# Patient Record
Sex: Female | Born: 1985 | Race: White | Hispanic: No | Marital: Single | Smoking: Never smoker
Health system: Southern US, Community
[De-identification: ages and names within clinical notes are randomized; demographics above are authoritative.]

## PROBLEM LIST (undated history)

## (undated) DIAGNOSIS — F419 Anxiety disorder, unspecified: Secondary | ICD-10-CM

---

## 2018-07-12 ENCOUNTER — Encounter (HOSPITAL_COMMUNITY): Payer: Self-pay | Admitting: Emergency Medicine

## 2018-07-12 ENCOUNTER — Ambulatory Visit (HOSPITAL_COMMUNITY)
Admission: EM | Admit: 2018-07-12 | Discharge: 2018-07-12 | Disposition: A | Discharge status: Home / Self Care | Payer: BLUE CROSS/BLUE SHIELD | Source: Home / Self Care | Attending: Family Medicine | Admitting: Family Medicine

## 2018-07-12 ENCOUNTER — Emergency Department (HOSPITAL_COMMUNITY)
Admission: EM | Admit: 2018-07-12 | Discharge: 2018-07-12 | Disposition: A | Discharge status: Home / Self Care | Payer: BLUE CROSS/BLUE SHIELD | Attending: Emergency Medicine | Admitting: Emergency Medicine

## 2018-07-12 ENCOUNTER — Emergency Department (HOSPITAL_COMMUNITY): Payer: BLUE CROSS/BLUE SHIELD

## 2018-07-12 ENCOUNTER — Encounter (HOSPITAL_COMMUNITY): Payer: Self-pay | Admitting: Family Medicine

## 2018-07-12 ENCOUNTER — Other Ambulatory Visit: Payer: Self-pay

## 2018-07-12 DIAGNOSIS — R519 Headache, unspecified: Secondary | ICD-10-CM

## 2018-07-12 DIAGNOSIS — R51 Headache: Secondary | ICD-10-CM | POA: Diagnosis not present

## 2018-07-12 DIAGNOSIS — F419 Anxiety disorder, unspecified: Secondary | ICD-10-CM

## 2018-07-12 HISTORY — DX: Anxiety disorder, unspecified: F41.9

## 2018-07-12 MED ORDER — METOCLOPRAMIDE HCL 5 MG/ML IJ SOLN
10.0000 mg | Freq: Once | INTRAMUSCULAR | Status: AC
  Administered 2018-07-12: 10 mg via INTRAVENOUS
  Filled 2018-07-12: qty 2

## 2018-07-12 MED ORDER — ONDANSETRON 4 MG PO TBDP
ORAL_TABLET | ORAL | Status: AC
  Filled 2018-07-12: qty 1

## 2018-07-12 MED ORDER — KETOROLAC TROMETHAMINE 60 MG/2ML IM SOLN
60.0000 mg | Freq: Once | INTRAMUSCULAR | Status: AC
  Administered 2018-07-12: 12:00:00 60 mg via INTRAMUSCULAR

## 2018-07-12 MED ORDER — LORAZEPAM 2 MG/ML IJ SOLN
2.0000 mg | Freq: Once | INTRAMUSCULAR | Status: AC
  Administered 2018-07-12: 2 mg via INTRAMUSCULAR

## 2018-07-12 MED ORDER — KETOROLAC TROMETHAMINE 60 MG/2ML IM SOLN
INTRAMUSCULAR | Status: AC
  Filled 2018-07-12: qty 2

## 2018-07-12 MED ORDER — ONDANSETRON 4 MG PO TBDP
4.0000 mg | ORAL_TABLET | Freq: Once | ORAL | Status: AC
  Administered 2018-07-12: 12:00:00 4 mg via ORAL

## 2018-07-12 MED ORDER — SODIUM CHLORIDE 0.9 % IV BOLUS
1000.0000 mL | Freq: Once | INTRAVENOUS | Status: AC
  Administered 2018-07-12: 14:00:00 1000 mL via INTRAVENOUS

## 2018-07-12 MED ORDER — LORAZEPAM 2 MG/ML IJ SOLN
INTRAMUSCULAR | Status: AC
  Filled 2018-07-12: qty 1

## 2018-07-12 MED ORDER — METOCLOPRAMIDE HCL 5 MG/ML IJ SOLN: 5.0000 mg | Freq: Once | INTRAMUSCULAR | Status: DC

## 2018-07-12 NOTE — Discharge Instructions (Signed)
Please read attached information. If you experience any new or worsening signs or symptoms please return to the emergency room for evaluation. Please follow-up with your primary care provider or specialist as discussed. Please use medication prescribed only as directed and discontinue taking if you have any concerning signs or symptoms.   °

## 2018-07-12 NOTE — ED Triage Notes (Signed)
Pt here from UC after waking up with severe headache and episode of vomiting from pain. Pt here for head CT and migraine cocktail after no relief from ativan at [facility]. Hx of similar migraine x 1.

## 2018-07-12 NOTE — ED Triage Notes (Signed)
Pt here for HA starting this morning with vomiting x 1; pt sts anxiety at present and appears anxious

## 2018-07-12 NOTE — ED Provider Notes (Signed)
[facility]   CSN: [id number] Arrival date & time: 07/12/18  1320    History   Chief Complaint Chief Complaint  Patient presents with   Migraine    HPI Dana OldJulie Powell is a 33 y.o. female.     HPI   33 year Powell female presents today with complaints of headache.  Patient notes that she woke up this morning with a frontal headache described as pressure.  She notes this feels like "a migraine".  She notes that usually when she has migraines they are not this severe.  She notes this is worse on the right side temporal region.  She notes that she has sought emergency care previously for headaches.  She denies any fever, neck stiffness, IV drug use, neurological deficits including vision changes.  She notes she felt nauseous this morning and feels like she does not want to eat now.  She denies any trauma to her head.  She was seen in urgent care where she was given benzodiazepine, Toradol and Zofran.  She notes she is still having a headache but notes her anxiety has improved.  She notes a recent dental extraction approximately 3 weeks ago, she denies any complaints of dental infection.  Past Medical History:  Diagnosis Date   Anxiety     There are no active problems to display for this patient.   History reviewed. No pertinent surgical history.   OB History   No obstetric history on file.      Home Medications    Prior to Admission medications   Not on File    Family History No family history on file.  Social History Social History   Tobacco Use   Smoking status: Not on file  Substance Use Topics   Alcohol use: Not on file   Drug use: Not on file     Allergies   Benadryl [diphenhydramine] and Nyquil severe cold-flu [phenyleph-doxylamine-dm-apap]   Review of Systems Review of Systems  All other systems reviewed and are negative.    Physical Exam Updated Vital Signs BP (!) 130/93 (BP Location:  Right Arm)    Pulse 86    Temp 98.3 F (36.8 C) (Oral)    Resp 18    SpO2 98%   Physical Exam Vitals signs and nursing note reviewed.  Constitutional:      Appearance: She is well-developed.  HENT:     Head: Normocephalic and atraumatic.     Comments: Oropharynx without edema, dentition without gumline swelling discharge or signs of infection Eyes:     General: No scleral icterus.       Right eye: No discharge.        Left eye: No discharge.     Conjunctiva/sclera: Conjunctivae normal.     Pupils: Pupils are equal, round, and reactive to light.  Neck:     Musculoskeletal: Normal range of motion.     Vascular: No JVD.     Trachea: No tracheal deviation.     Comments: Neck is supple with full active range of motion Pulmonary:     Effort: Pulmonary effort is normal.     Breath sounds: No stridor.  Neurological:     General: No focal deficit present.     Mental Status: She is alert and oriented to person, place, and time.     Cranial Nerves: No cranial nerve deficit.     Sensory: No sensory deficit.     Motor: No weakness.  Coordination: Coordination normal.  Psychiatric:        Mood and Affect: Mood normal.        Behavior: Behavior normal.        Thought Content: Thought content normal.        Judgment: Judgment normal.      ED Treatments / Results  Labs (all labs ordered are listed, but only abnormal results are displayed) Labs Reviewed - No data to display  EKG None  Radiology Ct Head Wo Contrast  Result Date: 07/12/2018 CLINICAL DATA:  Headaches EXAM: CT HEAD WITHOUT CONTRAST TECHNIQUE: Contiguous axial images were obtained from the base of the skull through the vertex without intravenous contrast. COMPARISON:  None. FINDINGS: Brain: No evidence of acute infarction, hemorrhage, hydrocephalus, extra-axial collection or mass lesion/mass effect. Vascular: No hyperdense vessel or unexpected calcification. Skull: No osseous abnormality. Sinuses/Orbits: Visualized  paranasal sinuses are clear. Visualized mastoid sinuses are clear. Visualized orbits demonstrate no focal abnormality. Other: None IMPRESSION: No acute intracranial pathology. Electronically Signed   By: Elige Ko   On: 07/12/2018 15:20    Procedures Procedures (including critical care time)  Medications Ordered in ED Medications  sodium chloride 0.9 % bolus 1,000 mL (1,000 mLs Intravenous New Bag/Given 07/12/18 1406)  metoCLOPramide (REGLAN) injection 10 mg (10 mg Intravenous Given 07/12/18 1407)     Initial Impression / Assessment and Plan / ED Course  I have reviewed the triage vital signs and the nursing notes.  Pertinent labs & imaging results that were available during my care of the patient were reviewed by me and considered in my medical decision making (see chart for details).       Labs:   Imaging:  Consults:  Therapeutics:  Discharge Meds:   Assessment/Plan: This is a 33 year Powell female with headache.  She notes she awoke with a headache this morning.  She has no acute neurological deficits, signs of trauma, neck stiffness, or any other red flags on my exam.  She notes similar history of migraines but also notes that this feels different.  Given her persistent symptoms head CT will be ordered.  I have very low suspicion for acute thrombotic event including sinus venous thrombosis, SAH, meningitis, dental pain causing headache, bleed, or any other acute life-threatening etiology at this time.   Pt's pain almost completely resolved- head CT is normal. She is stable for discharge with return precautions. She verbalized understanding and agreement to today's plan. No further questions or concerns.      Final Clinical Impressions(s) / ED Diagnoses   Final diagnoses:  Acute nonintractable headache, unspecified headache type    ED Discharge Orders    None       Rosalio Loud 07/12/18 1546    Tilden Fossa, MD 07/12/18 [phone]

## 2018-07-12 NOTE — ED Notes (Signed)
Patient transported to CT 

## 2018-07-12 NOTE — ED Notes (Signed)
Patient verbalizes understanding of discharge instructions . Opportunity for questions and answers were provided . Armband removed by staff ,Pt discharged from ED. W/C  offered at D/C  and Declined W/C at D/C and was escorted to lobby by RN.  

## 2018-07-12 NOTE — ED Provider Notes (Signed)
[facility] CARE CENTER   [id number] 07/12/18 Arrival Time: 1127  ASSESSMENT & PLAN:  1. Bad headache   2. Anxiety    Meds ordered this encounter  Medications  . ketorolac (TORADOL) injection 60 mg  . ondansetron (ZOFRAN-ODT) disintegrating tablet 4 mg  . LORazepam (ATIVAN) injection 2 mg   No focal neurologic abnormalities. No fever, nuchal rigidity, or vision changes. Reports feeling less anxious after medications above. Headache is basically unchanged after Toradol. After discussion, she elects ED evaluation vs home observation. To ED now via private vehicle. Stable upon discharge.  Reviewed expectations re: course of current medical issues. Questions answered. Outlined signs and symptoms indicating need for more acute intervention. Patient verbalized understanding. After Visit Summary given.   SUBJECTIVE:  Dana Powell is a 33 y.o. female who presents with complaint of a headache and associated "lightheaded feeling". Describes headache as a "migraine" but no formal diagnosis of migraines in the past. Reports abrupt onset approx 3 hours ago upon waking. Location: right-sided unilateral, temporal without radiation. History of headaches: yes but not very frequent. Has been hospitalized once in the distant past for a similar headache. Precipitating factors include: none which have been determined. Associated symptoms: Preceding aura: no. Nausea/vomiting: mild with one emesis this morning. Vision changes: no. Increased sensitivity to light and to noises: mild. Fever: no. No neck pain reported. Sinus pressure/congestion: no. Extremity weakness: no. Home treatment has included nothing. Current headache has not limited normal daily activities. Denies loss of balance, muscle weakness, numbness of extremities, speech difficulties and vision problems. No head injury reported. Ambulatory without difficulty.  No tobacco use. Occasional alcohol use. No illicit drug use.  ROS: As  per HPI. All other systems negative.    OBJECTIVE:  Vitals:   07/12/18 1143  BP: 131/88  Pulse: (!) 109  Resp: 20  Temp: 98.4 F (36.9 C)  TempSrc: Oral  SpO2: 98%    General appearance: alert; very anxious; pacing room and holding her head in her hands Eyes: PERRLA; EOMI; conjunctiva normal HENT: normocephalic; atraumatic Neck: supple with FROM Lungs: clear to auscultation bilaterally Heart: slight tachycardia; regular rhythm Extremities: no edema; symmetrical with no gross deformities Skin: warm and dry Neurologic: alert; clear speech; CN 2-12 grossly intact; no facial droop; normal gait; normal symmetric reflexes; normal extremity strength and sensation throughout; no seizure activity; no nuchal rigidity Psychological: alert and cooperative  Allergies  Allergen Reactions  . Benadryl [Diphenhydramine]   . Nyquil Severe Cold-Flu [Phenyleph-Doxylamine-Dm-Apap]     Past Medical History:  Diagnosis Date  . Anxiety    Social History   Socioeconomic History  . Marital status: Single    Spouse name: Not on file  . Number of children: Not on file  . Years of education: Not on file  . Highest education level: Not on file  Occupational History  . Not on file  Social Needs  . Financial resource strain: Not on file  . Food insecurity:    Worry: Not on file    Inability: Not on file  . Transportation needs:    Medical: Not on file    Non-medical: Not on file  Tobacco Use  . Smoking status: Not on file  Substance and Sexual Activity  . Alcohol use: Not on file  . Drug use: Not on file  . Sexual activity: Not on file  Lifestyle  . Physical activity:    Days per week: Not on file    Minutes per session: Not on file  .  Stress: Not on file  Relationships  . Social connections:    Talks on phone: Not on file    Gets together: Not on file    Attends religious service: Not on file    Active member of club or organization: Not on file    Attends meetings of clubs or  organizations: Not on file    Relationship status: Not on file  . Intimate partner violence:    Fear of current or ex partner: Not on file    Emotionally abused: Not on file    Physically abused: Not on file    Forced sexual activity: Not on file  Other Topics Concern  . Not on file  Social History Narrative  . Not on file   FH: Question of HTN.  History reviewed. No pertinent surgical history.   Mardella LaymanHagler, Dana Liebert, MD 07/12/18 1311

## 2018-07-14 ENCOUNTER — Encounter (HOSPITAL_COMMUNITY): Payer: Self-pay | Admitting: *Deleted

## 2018-07-14 ENCOUNTER — Other Ambulatory Visit: Payer: Self-pay

## 2018-07-14 ENCOUNTER — Emergency Department (HOSPITAL_COMMUNITY)
Admission: EM | Admit: 2018-07-14 | Discharge: 2018-07-14 | Disposition: A | Discharge status: Home / Self Care | Payer: BLUE CROSS/BLUE SHIELD | Attending: Emergency Medicine | Admitting: Emergency Medicine

## 2018-07-14 DIAGNOSIS — R519 Headache, unspecified: Secondary | ICD-10-CM

## 2018-07-14 DIAGNOSIS — R51 Headache: Secondary | ICD-10-CM | POA: Diagnosis not present

## 2018-07-14 MED ORDER — SODIUM CHLORIDE 0.9 % IV BOLUS
1000.0000 mL | Freq: Once | INTRAVENOUS | Status: AC
  Administered 2018-07-14: 16:00:00 1000 mL via INTRAVENOUS

## 2018-07-14 MED ORDER — METOCLOPRAMIDE HCL 5 MG/ML IJ SOLN
10.0000 mg | Freq: Once | INTRAMUSCULAR | Status: AC
  Administered 2018-07-14: 14:00:00 10 mg via INTRAVENOUS
  Filled 2018-07-14: qty 2

## 2018-07-14 MED ORDER — KETOROLAC TROMETHAMINE 30 MG/ML IJ SOLN
30.0000 mg | Freq: Once | INTRAMUSCULAR | Status: AC
  Administered 2018-07-14: 14:00:00 30 mg via INTRAVENOUS
  Filled 2018-07-14: qty 1

## 2018-07-14 MED ORDER — METOCLOPRAMIDE HCL 10 MG PO TABS: 10.0000 mg | ORAL_TABLET | Freq: Four times a day (QID) | ORAL | 0 refills | Status: AC

## 2018-07-14 MED ORDER — LORAZEPAM 2 MG/ML IJ SOLN
1.0000 mg | Freq: Once | INTRAMUSCULAR | Status: AC
  Administered 2018-07-14: 14:00:00 1 mg via INTRAVENOUS
  Filled 2018-07-14: qty 1

## 2018-07-14 NOTE — ED Notes (Signed)
Patient verbalizes understanding of discharge instructions . Opportunity for questions and answers were provided . Armband removed by staff ,Pt discharged from ED. W/C  offered at D/C  and Declined W/C at D/C and was escorted to lobby by RN.  

## 2018-07-14 NOTE — ED Triage Notes (Signed)
PT returns today for on going HA . Pt was in ED Monday . Dana Powell has not improved. Pt rfeports she had a CT of head on Monday.

## 2018-07-14 NOTE — ED Provider Notes (Signed)
[facility]ERGENCY DEPARTMENT Provider Note   CSN: [id number] Arrival date & time: 07/14/18  1247    History   Chief Complaint Chief Complaint  Patient presents with  . Headache    HPI Dana Powell is a 33 y.o. female.     HPI   33 year old female presents today with complaints of headache.  Patient notes she woke up this morning and developed frontal throbbing pressure headache.  She notes pressure behind her eyes.  She notes light sensitivity, denies any neurological complaints, vision changes, fever, neck stiffness, trauma, or any other red flags.  Patient was seen and evaluated for similar symptoms on 5/25, 2 days ago where she had a head CT she had near complete symptom resolution at the time of discharge.  She notes the symptoms had stayed away.  She denies any new changes in lifestyle including diet or activity.  No trauma.  She notes that the symptoms are slightly worse when laying back and improved when sitting up.  Nausea denies vomiting.  She notes a similar headache this severe approximately 2 years ago.   Past Medical History:  Diagnosis Date  . Anxiety     There are no active problems to display for this patient.   History reviewed. No pertinent surgical history.   OB History   No obstetric history on file.      Home Medications    Prior to Admission medications   Medication Sig Start Date End Date Taking? Authorizing Provider  metoCLOPramide (REGLAN) 10 MG tablet Take 1 tablet (10 mg total) by mouth every 6 (six) hours. 07/14/18   Eyvonne Mechanic, PA-C    Family History History reviewed. No pertinent family history.  Social History Social History   Tobacco Use  . Smoking status: Never Smoker  . Smokeless tobacco: Never Used  Substance Use Topics  . Alcohol use: Yes    Comment: social  . Drug use: Never     Allergies   Benadryl [diphenhydramine] and Nyquil severe cold-flu [phenyleph-doxylamine-dm-apap]   Review  of Systems Review of Systems  All other systems reviewed and are negative.    Physical Exam Updated Vital Signs BP 117/80 (BP Location: Right Arm)   Pulse 92   Temp 98.6 F (37 C) (Oral)   Resp 16   Ht 5\' 4"  (1.626 m)   Wt 80.7 kg   LMP 07/02/2018   SpO2 97%   BMI 30.55 kg/m   Physical Exam Vitals signs and nursing note reviewed.  Constitutional:      General: She is not in acute distress.    Appearance: She is well-developed. She is not diaphoretic.  HENT:     Head: Normocephalic and atraumatic.  Eyes:     General: No scleral icterus.       Right eye: No discharge.        Left eye: No discharge.     Conjunctiva/sclera: Conjunctivae normal.     Pupils: Pupils are equal, round, and reactive to light.  Neck:     Musculoskeletal: Normal range of motion and neck supple.     Vascular: No JVD.     Trachea: No tracheal deviation.  Pulmonary:     Effort: Pulmonary effort is normal.     Breath sounds: No stridor.  Musculoskeletal: Normal range of motion.        General: No tenderness.  Lymphadenopathy:     Cervical: No cervical adenopathy.  Neurological:     General: No focal deficit  present.     Mental Status: She is alert and oriented to person, place, and time. Mental status is at baseline.     GCS: GCS eye subscore is 4. GCS verbal subscore is 5. GCS motor subscore is 6.     Cranial Nerves: No cranial nerve deficit.     Sensory: No sensory deficit.     Motor: No weakness, tremor, atrophy, abnormal muscle tone or seizure activity.     Coordination: Coordination normal.     Gait: Gait normal.  Psychiatric:        Behavior: Behavior normal.        Thought Content: Thought content normal.        Judgment: Judgment normal.     ED Treatments / Results  Labs (all labs ordered are listed, but only abnormal results are displayed) Labs Reviewed - No data to display  EKG None  Radiology No results found.  Procedures Procedures (including critical care time)   Medications Ordered in ED Medications  sodium chloride 0.9 % bolus 1,000 mL (0 mLs Intravenous Stopped 07/14/18 1549)  ketorolac (TORADOL) 30 MG/ML injection 30 mg (30 mg Intravenous Given 07/14/18 1409)  metoCLOPramide (REGLAN) injection 10 mg (10 mg Intravenous Given 07/14/18 1409)  LORazepam (ATIVAN) injection 1 mg (1 mg Intravenous Given 07/14/18 1405)     Initial Impression / Assessment and Plan / ED Course  I have reviewed the triage vital signs and the nursing notes.  Pertinent labs & imaging results that were available during my care of the patient were reviewed by me and considered in my medical decision making (see chart for details).        Therapeutics: Toradol, Reglan, normal saline  Discharge Meds: Reglan  Assessment/Plan: [age] YOF presents today with recurring headache.  She was seen several days ago symptoms did improve dramatically but then again repaired this morning.  Again I do not feel she has any significant life-threatening intracranial abnormality.  No signs of intracranial hypertension, vascular, mass, infectious etiology, or bleed.  Patient had again dramatic improvement in her symptoms while here with no neurological deficits or red flags.  Again her CT scan from 2 days ago was normal.  She is encouraged to follow-up as an outpatient with neurology, she is given strict return precautions.  She verbalized understanding and agreement to today's plan had no further questions or concerns.   Final Clinical Impressions(s) / ED Diagnoses   Final diagnoses:  Acute nonintractable headache, unspecified headache type    ED Discharge Orders         Ordered    metoCLOPramide (REGLAN) 10 MG tablet  Every 6 hours     07/14/18 1557           Eyvonne MechanicHedges, Acey [place], PA-C 07/14/18 1850    Geoffery Lyonselo, Douglas, MD 07/15/18 [phone]

## 2018-07-14 NOTE — Discharge Instructions (Signed)
Please read attached information. If you experience any new or worsening signs or symptoms please return to the emergency room for evaluation. Please follow-up with your primary care provider or specialist as discussed. Please use medication prescribed only as directed and discontinue taking if you have any concerning signs or symptoms.   °

## 2018-07-14 NOTE — ED Triage Notes (Signed)
PT reports she clenches her teeth due to her anxiety

## 2018-07-24 ENCOUNTER — Encounter (HOSPITAL_COMMUNITY): Payer: Self-pay | Admitting: Emergency Medicine

## 2018-07-24 ENCOUNTER — Other Ambulatory Visit: Payer: Self-pay

## 2018-07-24 ENCOUNTER — Ambulatory Visit (HOSPITAL_COMMUNITY)
Admission: EM | Admit: 2018-07-24 | Discharge: 2018-07-24 | Disposition: A | Discharge status: Home / Self Care | Payer: BLUE CROSS/BLUE SHIELD | Attending: Family Medicine | Admitting: Family Medicine

## 2018-07-24 DIAGNOSIS — F419 Anxiety disorder, unspecified: Secondary | ICD-10-CM

## 2018-07-24 MED ORDER — ONDANSETRON 8 MG PO TBDP: 8.0000 mg | ORAL_TABLET | Freq: Three times a day (TID) | ORAL | 0 refills | Status: AC | PRN

## 2018-07-24 MED ORDER — LORAZEPAM 1 MG PO TABS: 1.0000 mg | ORAL_TABLET | Freq: Two times a day (BID) | ORAL | 0 refills | Status: AC | PRN

## 2018-07-24 NOTE — ED Provider Notes (Signed)
[facility]    CSN: [id number] Arrival date & time: 07/24/18  1408     History   Chief Complaint Chief Complaint  Patient presents with  . Anxiety    HPI Dana Powell is a 33 y.o. female.   Established patient who was seen here 10 days ago for headache.    she c/o  anxiety and is very anxious today. Pt has appointment On Tuesday with primary but having rough time sleeping and dealing with things     Past Medical History:  Diagnosis Date  . Anxiety     There are no active problems to display for this patient.   History reviewed. No pertinent surgical history.  OB History   No obstetric history on file.      Home Medications    Prior to Admission medications   Medication Sig Start Date End Date Taking? Authorizing Provider  LORazepam (ATIVAN) 1 MG tablet Take 1 tablet (1 mg total) by mouth 2 (two) times daily as needed for anxiety. 07/24/18   Robyn Haber, MD  metoCLOPramide (REGLAN) 10 MG tablet Take 1 tablet (10 mg total) by mouth every 6 (six) hours. 07/14/18   Hedges, Dellis Filbert, PA-C  ondansetron (ZOFRAN-ODT) 8 MG disintegrating tablet Take 1 tablet (8 mg total) by mouth every 8 (eight) hours as needed for nausea. 07/24/18   Robyn Haber, MD    Family History History reviewed. No pertinent family history.  Social History Social History   Tobacco Use  . Smoking status: Never Smoker  . Smokeless tobacco: Never Used  Substance Use Topics  . Alcohol use: Yes    Comment: social  . Drug use: Never     Allergies   Benadryl [diphenhydramine] and Nyquil severe cold-flu [phenyleph-doxylamine-dm-apap]   Review of Systems Review of Systems  Gastrointestinal: Positive for nausea.  Psychiatric/Behavioral: The patient is nervous/anxious.   All other systems reviewed and are negative.    Physical Exam Triage Vital Signs ED Triage Vitals  Enc Vitals Group     BP 07/24/18 1446 (!) 147/107     Pulse Rate 07/24/18 1446 100     Resp  07/24/18 1446 16     Temp 07/24/18 1446 98.6 F (37 C)     Temp Source 07/24/18 1446 Oral     SpO2 07/24/18 1446 98 %     Weight --      Height --      Head Circumference --      Peak Flow --      Pain Score 07/24/18 1445 0     Pain Loc --      Pain Edu? --      Excl. in [place]? --    No data found.  Updated Vital Signs BP (!) 147/107 (BP Location: Right Arm)   Pulse 100   Temp 98.6 F (37 C) (Oral)   Resp 16   LMP 07/02/2018   SpO2 98%    Physical Exam Vitals signs and nursing note reviewed.  Constitutional:      Appearance: Normal appearance. She is normal weight.  HENT:     Head: Normocephalic.     Mouth/Throat:     Pharynx: Oropharynx is clear.  Eyes:     Conjunctiva/sclera: Conjunctivae normal.  Neck:     Musculoskeletal: Normal range of motion and neck supple.  Cardiovascular:     Pulses: Normal pulses.  Musculoskeletal: Normal range of motion.  Skin:    General: Skin is warm and dry.  Neurological:     General: No focal deficit present.     Mental Status: She is alert.  Psychiatric:     Comments: Patient shaking but coherent and cooperative.   Notes a bad experience with good friend dying from aspiration.      UC Treatments / Results  Labs (all labs ordered are listed, but only abnormal results are displayed) Labs Reviewed - No data to display  EKG None  Radiology No results found.  Procedures Procedures (including critical care time)  Medications Ordered in UC Medications - No data to display  Initial Impression / Assessment and Plan / UC Course  I have reviewed the triage vital signs and the nursing notes.  Pertinent labs & imaging results that were available during my care of the patient were reviewed by me and considered in my medical decision making (see chart for details).    Final Clinical Impressions(s) / UC Diagnoses   Final diagnoses:  Anxiety   Discharge Instructions   None    ED Prescriptions    Medication Sig  Dispense Auth. Provider   ondansetron (ZOFRAN-ODT) 8 MG disintegrating tablet Take 1 tablet (8 mg total) by mouth every 8 (eight) hours as needed for nausea. 12 tablet Elvina SidleLauenstein, Andra Matsuo, MD   LORazepam (ATIVAN) 1 MG tablet Take 1 tablet (1 mg total) by mouth 2 (two) times daily as needed for anxiety. 15 tablet Elvina SidleLauenstein, Raine Elsass, MD     Controlled Substance Prescriptions [place] Controlled Substance Registry consulted? Not Applicable   Elvina SidleLauenstein, Itzia Cunliffe, MD 07/24/18 1510

## 2018-07-24 NOTE — ED Triage Notes (Signed)
Per pt she has of anxiety and very anxious today. Pt has appointment On Tuesday with primary but having rough time sleeping and dealing with things

## 2019-11-26 ENCOUNTER — Other Ambulatory Visit: Payer: Self-pay

## 2019-11-26 ENCOUNTER — Emergency Department (HOSPITAL_COMMUNITY): Payer: BLUE CROSS/BLUE SHIELD

## 2019-11-26 ENCOUNTER — Encounter (HOSPITAL_COMMUNITY): Payer: Self-pay

## 2019-11-26 ENCOUNTER — Emergency Department (HOSPITAL_COMMUNITY)
Admission: EM | Admit: 2019-11-26 | Discharge: 2019-11-26 | Disposition: A | Discharge status: Home / Self Care | Payer: BLUE CROSS/BLUE SHIELD | Attending: Emergency Medicine | Admitting: Emergency Medicine

## 2019-11-26 DIAGNOSIS — R059 Cough, unspecified: Secondary | ICD-10-CM | POA: Diagnosis not present

## 2019-11-26 DIAGNOSIS — R079 Chest pain, unspecified: Secondary | ICD-10-CM | POA: Diagnosis present

## 2019-11-26 DIAGNOSIS — R0789 Other chest pain: Secondary | ICD-10-CM | POA: Diagnosis not present

## 2019-11-26 LAB — BASIC METABOLIC PANEL
Anion gap: 11 (ref 5–15)
BUN: 8 mg/dL (ref 6–20)
CO2: 22 mmol/L (ref 22–32)
Calcium: 8.9 mg/dL (ref 8.9–10.3)
Chloride: 103 mmol/L (ref 98–111)
Creatinine, Ser: 0.66 mg/dL (ref 0.44–1.00)
GFR, Estimated: >60 mL/min (ref >60)
Glucose, Bld: 104 mg/dL — ABNORMAL HIGH (ref 70–99)
Potassium: 3.6 mmol/L (ref 3.5–5.1)
Sodium: 136 mmol/L (ref 135–145)

## 2019-11-26 LAB — I-STAT BETA HCG BLOOD, ED (MC, WL, AP ONLY): I-stat hCG, quantitative: <5 m[IU]/mL (ref <5)

## 2019-11-26 LAB — CBC
HCT: 48.5 % — ABNORMAL HIGH (ref 36.0–46.0)
Hemoglobin: 16.8 g/dL — ABNORMAL HIGH (ref 12.0–15.0)
MCH: 36 pg — ABNORMAL HIGH (ref 26.0–34.0)
MCHC: 34.6 g/dL (ref 30.0–36.0)
MCV: 103.9 fL — ABNORMAL HIGH (ref 80.0–100.0)
Platelets: 314 10*3/uL (ref 150–400)
RBC: 4.67 MIL/uL (ref 3.87–5.11)
RDW: 12.1 % (ref 11.5–15.5)
WBC: 9.6 10*3/uL (ref 4.0–10.5)
nRBC: 0 % (ref 0.0–0.2)

## 2019-11-26 LAB — TROPONIN I (HIGH SENSITIVITY): Troponin I (High Sensitivity): 2 ng/L (ref <18)

## 2019-11-26 LAB — D-DIMER, QUANTITATIVE: D-Dimer, Quant: 0.34 ug/mL-FEU (ref 0.00–0.50)

## 2019-11-26 MED ORDER — BENZONATATE 100 MG PO CAPS: 100.0000 mg | ORAL_CAPSULE | Freq: Three times a day (TID) | ORAL | 0 refills | Status: AC

## 2019-11-26 MED ORDER — KETOROLAC TROMETHAMINE 30 MG/ML IJ SOLN
30.0000 mg | Freq: Once | INTRAMUSCULAR | Status: AC
  Administered 2019-11-26: 30 mg via INTRAVENOUS
  Filled 2019-11-26: qty 1

## 2019-11-26 MED ORDER — CYCLOBENZAPRINE HCL 10 MG PO TABS: 10.0000 mg | ORAL_TABLET | Freq: Three times a day (TID) | ORAL | 0 refills | Status: AC | PRN

## 2019-11-26 MED ORDER — IBUPROFEN 800 MG PO TABS: 800.0000 mg | ORAL_TABLET | Freq: Three times a day (TID) | ORAL | 0 refills | Status: AC

## 2019-11-26 NOTE — ED Triage Notes (Signed)
Pt reports left sided chest pain that has been getting worse over the last couple weeks.

## 2019-11-26 NOTE — Discharge Instructions (Addendum)
It is unclear what is causing your chest pain.  This could be because of the cough you have been having for the past couple of weeks.  I have prescribed some cough medicine and an anti-inflammatory.  Please return if you have fever or shortness of breath.  Please follow-up with your regular doctor.

## 2019-11-26 NOTE — ED Provider Notes (Signed)
[facility]-EMERGENCY DEPT Provider Note   CSN: [id number] Arrival date & time: 11/26/19  0043     History Chief Complaint  Patient presents with  . Chest Pain    Dana Powell is a 34 y.o. female.  Patient presents to the emergency department with a chief complaint of chest pain.  She states that she has been having left-sided chest pain for the past 3 to 4 weeks.  She states the pain is worsened with breathing.  She denies having shortness of breath, but states that the pain does take her breath away at times.  She states that she saw her primary care doctor, and he told her that it was because her "breasts were too large."  She denies having any fever, chills, but has had slight cough.  She is fully vaccinated against Covid-19, denies any recent exposures.  She states that she has had some right calf pain, but not denies any recent long travel, surgery, immobilization.  She is not on OCPs.  She denies history of PE or DVT.  The history is provided by the patient. No language interpreter was used.       Past Medical History:  Diagnosis Date  . Anxiety     There are no problems to display for this patient.   History reviewed. No pertinent surgical history.   OB History   No obstetric history on file.     No family history on file.  Social History   Tobacco Use  . Smoking status: Never Smoker  . Smokeless tobacco: Never Used  Substance Use Topics  . Alcohol use: Yes    Comment: social  . Drug use: Never    Home Medications Prior to Admission medications   Medication Sig Start Date End Date Taking? Authorizing Provider  LORazepam (ATIVAN) 1 MG tablet Take 1 tablet (1 mg total) by mouth 2 (two) times daily as needed for anxiety. 07/24/18   Elvina Sidle, MD  metoCLOPramide (REGLAN) 10 MG tablet Take 1 tablet (10 mg total) by mouth every 6 (six) hours. 07/14/18   Hedges, Tinnie Gens, PA-C  ondansetron (ZOFRAN-ODT) 8 MG disintegrating tablet Take  1 tablet (8 mg total) by mouth every 8 (eight) hours as needed for nausea. 07/24/18   Elvina Sidle, MD  PARoxetine (PAXIL) 10 MG tablet Take 1 tablet by mouth daily. 11/18/19   [provider]    Allergies    Benadryl [diphenhydramine] and Nyquil severe cold-flu [phenyleph-doxylamine-dm-apap]  Review of Systems   Review of Systems  All other systems reviewed and are negative.   Physical Exam Updated Vital Signs BP (!) 147/98 (BP Location: Left Arm)   Pulse (!) 112   Temp 98.4 F (36.9 C) (Oral)   Resp (!) 24   Ht 5\' 4"  (1.626 m)   Wt 80.7 kg   LMP 11/05/2019   SpO2 96%   BMI 30.55 kg/m   Physical Exam Vitals and nursing note reviewed.  Constitutional:      General: She is not in acute distress.    Appearance: She is well-developed.  HENT:     Head: Normocephalic and atraumatic.  Eyes:     Conjunctiva/sclera: Conjunctivae normal.  Cardiovascular:     Rate and Rhythm: Normal rate and regular rhythm.     Heart sounds: No murmur heard.   Pulmonary:     Effort: Pulmonary effort is normal. No respiratory distress.     Breath sounds: Normal breath sounds.  Abdominal:  Palpations: Abdomen is soft.     Tenderness: There is no abdominal tenderness.  Musculoskeletal:        General: Normal range of motion.     Cervical back: Neck supple.  Skin:    General: Skin is warm and dry.  Neurological:     Mental Status: She is alert and oriented to person, place, and time.  Psychiatric:        Powell and Affect: Powell normal.        Behavior: Behavior normal.     ED Results / Procedures / Treatments   Labs (all labs ordered are listed, but only abnormal results are displayed) Labs Reviewed  BASIC METABOLIC PANEL  CBC  D-DIMER, QUANTITATIVE (NOT AT [facility])  I-STAT BETA HCG BLOOD, ED ([facility], [facility], [facility] ONLY)  TROPONIN I (HIGH SENSITIVITY)    EKG None  Radiology DG Chest 2 View  Result Date: 11/26/2019 CLINICAL DATA:  Chest pain EXAM: CHEST - 2 VIEW COMPARISON:   None. FINDINGS: The heart size and mediastinal contours are within normal limits. Both lungs are clear. The visualized skeletal structures are unremarkable. IMPRESSION: No active cardiopulmonary disease. Electronically Signed   By: Katherine Mantle M.D.   On: 11/26/2019 01:30    Procedures Procedures (including critical care time)  Medications Ordered in ED Medications  ketorolac (TORADOL) 30 MG/ML injection 30 mg (30 mg Intravenous Given 11/26/19 0131)    ED Course  I have reviewed the triage vital signs and the nursing notes.  Pertinent labs & imaging results that were available during my care of the patient were reviewed by me and considered in my medical decision making (see chart for details).    MDM Rules/Calculators/A&P                          This patient complains of left-sided chest pain, this involves an extensive number of treatment options, and is a complaint that carries with it a high risk of complications and morbidity.    Differential Dx ACS, PE, pneumonia, pleurisy, muscle spasm  Pertinent Labs I ordered, reviewed, and interpreted labs, which included CBC, BMP, hCG, and troponin, and D-dimer, all of which are reassuring.  Doubt ACS given the length of time since onset (3 to 4 weeks).  She received Covid vaccine 1 week ago, but again symptoms have been present for 3 to 4 weeks, doubt pericarditis/myocarditis.  No leukocytosis, doubt infection.  Imaging Interpretation I ordered imaging studies which included chest x-ray.  I independently visualized and interpreted the chest x-ray, which showed no obvious abnormality.  Doubt pneumonia or Covid.  Medications I ordered medication Toradol for pain.  Reassessments After the interventions stated above, I reevaluated the patient and found still having some discomfort.  She tells me on reexam that she does sometimes see some redness beneath her breasts.  Chaperone was present as I examined her breast, there is no evidence  of cellulitis, abscess, or Candida.  Consultants None  Plan Discharge with further outpatient follow-up.  No further emergent work-up indicated tonight.  Will trial cough medicine, she has been having a cough for the past several weeks.  We will also trial muscle relaxer for possible intercostal strain.    Final Clinical Impression(s) / ED Diagnoses Final diagnoses:  Nonspecific chest pain  Cough  Chest wall pain    Rx / DC Orders ED Discharge Orders         Ordered    ibuprofen (ADVIL)  800 MG tablet  3 times daily        11/26/19 0312    benzonatate (TESSALON) 100 MG capsule  Every 8 hours        11/26/19 0312    cyclobenzaprine (FLEXERIL) 10 MG tablet  3 times daily PRN        11/26/19 0312           Roxy Horseman, PA-C 11/26/19 0327    Palumbo, April, MD 11/26/19 [phone]

## 2019-12-28 ENCOUNTER — Emergency Department (HOSPITAL_COMMUNITY): Payer: BLUE CROSS/BLUE SHIELD

## 2019-12-28 ENCOUNTER — Emergency Department (HOSPITAL_COMMUNITY)
Admission: EM | Admit: 2019-12-28 | Discharge: 2019-12-28 | Disposition: A | Discharge status: Home / Self Care | Payer: BLUE CROSS/BLUE SHIELD | Attending: Emergency Medicine | Admitting: Emergency Medicine

## 2019-12-28 ENCOUNTER — Other Ambulatory Visit: Payer: Self-pay

## 2019-12-28 DIAGNOSIS — R002 Palpitations: Secondary | ICD-10-CM | POA: Diagnosis present

## 2019-12-28 DIAGNOSIS — R072 Precordial pain: Secondary | ICD-10-CM

## 2019-12-28 DIAGNOSIS — I471 Supraventricular tachycardia: Secondary | ICD-10-CM | POA: Diagnosis not present

## 2019-12-28 LAB — D-DIMER, QUANTITATIVE: D-Dimer, Quant: 0.44 ug/mL-FEU (ref 0.00–0.50)

## 2019-12-28 LAB — CBC WITH DIFFERENTIAL/PLATELET
Abs Immature Granulocytes: 0.04 10*3/uL (ref 0.00–0.07)
Basophils Absolute: 0.1 10*3/uL (ref 0.0–0.1)
Basophils Relative: 1 %
Eosinophils Absolute: 0.1 10*3/uL (ref 0.0–0.5)
Eosinophils Relative: 1 %
HCT: 48.4 % — ABNORMAL HIGH (ref 36.0–46.0)
Hemoglobin: 16.8 g/dL — ABNORMAL HIGH (ref 12.0–15.0)
Immature Granulocytes: 1 %
Lymphocytes Relative: 31 %
Lymphs Abs: 2.6 10*3/uL (ref 0.7–4.0)
MCH: 36.5 pg — ABNORMAL HIGH (ref 26.0–34.0)
MCHC: 34.7 g/dL (ref 30.0–36.0)
MCV: 105.2 fL — ABNORMAL HIGH (ref 80.0–100.0)
Monocytes Absolute: 0.6 10*3/uL (ref 0.1–1.0)
Monocytes Relative: 7 %
Neutro Abs: 5.2 10*3/uL (ref 1.7–7.7)
Neutrophils Relative %: 59 %
Platelets: 332 10*3/uL (ref 150–400)
RBC: 4.6 MIL/uL (ref 3.87–5.11)
RDW: 12.6 % (ref 11.5–15.5)
WBC: 8.6 10*3/uL (ref 4.0–10.5)
nRBC: 0 % (ref 0.0–0.2)

## 2019-12-28 LAB — TROPONIN I (HIGH SENSITIVITY)
Troponin I (High Sensitivity): 10 ng/L (ref <18)
Troponin I (High Sensitivity): 37 ng/L — ABNORMAL HIGH (ref <18)
Troponin I (High Sensitivity): 33 ng/L — ABNORMAL HIGH (ref <18)

## 2019-12-28 LAB — TSH: TSH: 3.035 u[IU]/mL (ref 0.350–4.500)

## 2019-12-28 LAB — MAGNESIUM: Magnesium: 2.4 mg/dL (ref 1.7–2.4)

## 2019-12-28 LAB — BASIC METABOLIC PANEL
Anion gap: 14 (ref 5–15)
BUN: <5 mg/dL — ABNORMAL LOW (ref 6–20)
CO2: 19 mmol/L — ABNORMAL LOW (ref 22–32)
Calcium: 8.9 mg/dL (ref 8.9–10.3)
Chloride: 104 mmol/L (ref 98–111)
Creatinine, Ser: 0.72 mg/dL (ref 0.44–1.00)
GFR, Estimated: >60 mL/min (ref >60)
Glucose, Bld: 94 mg/dL (ref 70–99)
Potassium: 5.2 mmol/L — ABNORMAL HIGH (ref 3.5–5.1)
Sodium: 137 mmol/L (ref 135–145)

## 2019-12-28 MED ORDER — SODIUM CHLORIDE 0.9 % IV BOLUS
1000.0000 mL | Freq: Once | INTRAVENOUS | Status: AC
  Administered 2019-12-28: 1000 mL via INTRAVENOUS

## 2019-12-28 MED ORDER — LORAZEPAM 2 MG/ML IJ SOLN
1.0000 mg | Freq: Once | INTRAMUSCULAR | Status: AC
  Administered 2019-12-28: 1 mg via INTRAVENOUS
  Filled 2019-12-28: qty 1

## 2019-12-28 NOTE — ED Provider Notes (Signed)
7:26 AM Care assumed from Dr. Wilkie Aye.  At time of transfer of care, patient is waiting for third troponin to assess patient after she had SVT which was actually converted with adenosine with EMS.  Plan of care will be to discharge home with cardiology follow-up if it is similar or downtrending.  Patient's other labs were overall reassuring and her D-dimer was negative, doubt PE.  Chest x-ray also was reassuring.  Anticipate discharge after reassessment and third troponin.  3rd troponin is downtrending.  Had a long shared decision made conversation with patient and we agreed that she appears stable for discharge.  She had an episode when she 1st came to the emergency department with recurrent SVT but then a vagal new effect.  I discussed that her labs otherwise were reassuring including TSH and D-dimer.  No evidence of AKI.  Patient was encouraged to stay hydrated as dehydration can contribute chest is a CT.  We also instructed her to help treat her stress at home she reports she still has Ativan and does not need for more of that.  We will have her follow-up with cardiology and patient agrees.  Patient is resting comfortably.  After p.o. challenge, anticipate discharge home.   Patient felt better after reassessment and third troponin.  She will be discharged home to follow with cardiology.  She agrees.  Patient discharged in good condition.  Clinical Impression: 1. SVT (supraventricular tachycardia) (HCC)   2. Precordial pain     Disposition: Discharge  Condition: Good  I have discussed the results, Dx and Tx plan with the pt(& family if present). He/she/they expressed understanding and agree(s) with the plan. Discharge instructions discussed at great length. Strict return precautions discussed and pt &/or family have verbalized understanding of the instructions. No further questions at time of discharge.    Discharge Medication List as of 12/28/2019 12:42 PM      Follow Up: [facility] CARDIOVASCULAR DIVISION [address] [place] [address] [phone]    [facility] EMERGENCY DEPARTMENT [address] [id number] mc [place] [address] [phone]    Burton Apley, MD [address] [place] [address] [phone]        Zaela Graley, Canary Brim, MD 12/28/19 [phone]

## 2019-12-28 NOTE — ED Notes (Signed)
PA made aware, came to assess pt

## 2019-12-28 NOTE — Discharge Instructions (Signed)
Your history and exam are consistent with episodes of SVT.  You were able to get out of it both with the vagal maneuvers and with the medications.  Your other labs were reassuring and your heart enzymes elevated then decreased.  We suspect this is related to fast heart rate was going for period of time.  I suspect he also has some chest discomfort with muscle soreness from this as well.  Given your otherwise stability and well appearance, we feel you are safe for discharge home but do want you to follow-up with cardiology.  Please increase your hydration at home and try to improve your stress and anxiety if possible.  Please use your home medications for this.  If any symptoms change or worsen, was return to the nearest emergency department.  Please follow-up with cardiology as we discussed.

## 2019-12-28 NOTE — ED Notes (Signed)
Pt ambulated to and from restroom without difficulty. States chest pain is currently 6/10. Pt alert and oriented X 4.

## 2019-12-28 NOTE — ED Triage Notes (Signed)
EMS arrival from home co chest pain starting 30 prior to EMS arrival. SVT 220s 6/12mg  Adenosine given with conversion to ST 300 mL NS. Per EMS no cardiac hx, hx of anxiety.   Vitals  136/80  96% RA 18 R

## 2019-12-28 NOTE — ED Notes (Signed)
Pt having active run SVT 140s on cardiac monitor, this RN instructed and demonstrated vagal manovur with pt, pt returned demonstrating, self converting to ST 120-130, centeraliozed chest pain 7/10. Pt tearful and appears anxious at this time.

## 2019-12-28 NOTE — ED Notes (Signed)
Pt drank apple juice without difficulty  

## 2019-12-28 NOTE — ED Provider Notes (Signed)
[facility] EMERGENCY DEPARTMENT Provider Note   CSN: [id number] Arrival date & time: 12/28/19  0207     History Chief Complaint  Patient presents with  . Chest Pain    Dana Powell is a 34 y.o. female.  HPI     This is a 34 year old female with a history of anxiety who presents with palpitations and chest pain.  Patient reports that she was at home talking to her mother on the phone when she lay down flat and had acute onset of palpitations and heart racing.  She states she had difficulty palpating her pulse.  She felt very anxious at this time but states it feels different than her normal anxiety.  She called EMS.  She was noted to be in SVT with a heart rate in the 220s.  Attempted vagal maneuvers failed.  She was given 6 mg and then 12 mg of adenosine with some relief.  She states when she was given adenosine she had excruciating chest discomfort which has resolved.  Now she states she is anxious.  No recent changes in medications, diet, caffeine intake.  She states she uses very little caffeine.  She denies any alcohol or drug use.  Was feeling well prior to this.  Patient reports that she was seen and evaluated approximately 1 month ago for some pain under her left breast.  Past Medical History:  Diagnosis Date  . Anxiety     There are no problems to display for this patient.   No past surgical history on file.   OB History   No obstetric history on file.     No family history on file.  Social History   Tobacco Use  . Smoking status: Never Smoker  . Smokeless tobacco: Never Used  Substance Use Topics  . Alcohol use: Yes    Comment: social  . Drug use: Never    Home Medications Prior to Admission medications   Medication Sig Start Date End Date Taking? Authorizing Provider  ibuprofen (ADVIL) 800 MG tablet Take 1 tablet (800 mg total) by mouth 3 (three) times daily. Patient taking differently: Take 800 mg by mouth every 8 (eight) hours  as needed for headache or moderate pain.  11/26/19  Yes Roxy Horseman, PA-C  LORazepam (ATIVAN) 1 MG tablet Take 1 tablet (1 mg total) by mouth 2 (two) times daily as needed for anxiety. 07/24/18  Yes Elvina Sidle, MD  metoCLOPramide (REGLAN) 10 MG tablet Take 1 tablet (10 mg total) by mouth every 6 (six) hours. Patient taking differently: Take 10 mg by mouth every 6 (six) hours as needed for nausea (headaches).  07/14/18  Yes Hedges, Tinnie Gens, PA-C  Multiple Vitamin (MULTIVITAMIN ADULT PO) Take 1 tablet by mouth daily.   Yes [provider]  benzonatate (TESSALON) 100 MG capsule Take 1 capsule (100 mg total) by mouth every 8 (eight) hours. Patient not taking: Reported on 12/28/2019 11/26/19   Roxy Horseman, PA-C  cyclobenzaprine (FLEXERIL) 10 MG tablet Take 1 tablet (10 mg total) by mouth 3 (three) times daily as needed for muscle spasms. Patient not taking: Reported on 12/28/2019 11/26/19   Roxy Horseman, PA-C  ondansetron (ZOFRAN-ODT) 8 MG disintegrating tablet Take 1 tablet (8 mg total) by mouth every 8 (eight) hours as needed for nausea. Patient not taking: Reported on 11/26/2019 07/24/18   Elvina Sidle, MD    Allergies    Benadryl [diphenhydramine], Nyquil severe cold-flu [phenyleph-doxylamine-dm-apap], and Eggs or egg-derived products  Review of Systems  Review of Systems  Constitutional: Negative for fever.  Respiratory: Negative for shortness of breath.   Cardiovascular: Positive for chest pain and palpitations. Negative for leg swelling.  Gastrointestinal: Negative for abdominal pain, nausea and vomiting.  Genitourinary: Negative for dyspareunia.  All other systems reviewed and are negative.   Physical Exam Updated Vital Signs BP (!) 142/93   Pulse (!) 131   Temp 99.1 F (37.3 C) (Oral)   Resp 17   Ht 1.626 m (5\' 4" )   SpO2 99%   BMI 30.55 kg/m   Physical Exam Vitals and nursing note reviewed.  Constitutional:      Appearance: She is well-developed.  She is obese. She is not ill-appearing.  HENT:     Head: Normocephalic and atraumatic.  Eyes:     Pupils: Pupils are equal, round, and reactive to light.  Cardiovascular:     Rate and Rhythm: Regular rhythm. Tachycardia present.     Heart sounds: Normal heart sounds.  Pulmonary:     Effort: Pulmonary effort is normal. No respiratory distress.     Breath sounds: No wheezing.  Abdominal:     General: Bowel sounds are normal.     Palpations: Abdomen is soft.  Musculoskeletal:     Cervical back: Neck supple.     Right lower leg: No tenderness. No edema.     Left lower leg: No tenderness. No edema.  Skin:    General: Skin is warm and dry.  Neurological:     Mental Status: She is alert and oriented to person, place, and time.  Psychiatric:        Mood and Affect: Mood is anxious.     ED Results / Procedures / Treatments   Labs (all labs ordered are listed, but only abnormal results are displayed) Labs Reviewed  CBC WITH DIFFERENTIAL/PLATELET - Abnormal; Notable for the following components:      Result Value   Hemoglobin 16.8 (*)    HCT 48.4 (*)    MCV 105.2 (*)    MCH 36.5 (*)    All other components within normal limits  BASIC METABOLIC PANEL - Abnormal; Notable for the following components:   Potassium 5.2 (*)    CO2 19 (*)    BUN <5 (*)    All other components within normal limits  TROPONIN I (HIGH SENSITIVITY) - Abnormal; Notable for the following components:   Troponin I (High Sensitivity) 37 (*)    All other components within normal limits  TSH  MAGNESIUM  D-DIMER, QUANTITATIVE (NOT AT [facility])  TROPONIN I (HIGH SENSITIVITY)    EKG EKG Interpretation  Date/Time:  Wednesday December 28 2019 02:16:17 EST Ventricular Rate:  124 PR Interval:    QRS Duration: 62 QT Interval:  323 QTC Calculation: 464 R Axis:   89 Text Interpretation: Sinus tachycardia Borderline repolarization abnormality Confirmed by [id number] (Ross Marcus) on 12/28/2019 2:45:30  AM   Radiology DG Chest Portable 1 View  Result Date: 12/28/2019 CLINICAL DATA:  Palpitation.  SVT. EXAM: PORTABLE CHEST 1 VIEW COMPARISON:  11/26/2019 FINDINGS: Hazy density at the bases without air bronchogram, best attributed to breast shadows. No edema, effusion, or pneumothorax. Normal heart size. IMPRESSION: No active disease. Electronically Signed   By: 01/26/2020 M.D.   On: 12/28/2019 04:13    Procedures Procedures (including critical care time)  CRITICAL CARE Performed by: 13/11/2019   Total critical care time: 40 minutes  Critical care time was exclusive of separately billable procedures and  treating other patients.  Critical care was necessary to treat or prevent imminent or life-threatening deterioration.  Critical care was time spent personally by me on the following activities: development of treatment plan with patient and/or surrogate as well as nursing, discussions with consultants, evaluation of patient's response to treatment, examination of patient, obtaining history from patient or surrogate, ordering and performing treatments and interventions, ordering and review of laboratory studies, ordering and review of radiographic studies, pulse oximetry and re-evaluation of patient's condition.   Medications Ordered in ED Medications  LORazepam (ATIVAN) injection 1 mg (has no administration in time range)  sodium chloride 0.9 % bolus 1,000 mL (0 mLs Intravenous Stopped 12/28/19 0509)  LORazepam (ATIVAN) injection 1 mg (1 mg Intravenous Given 12/28/19 9563)    ED Course  I have reviewed the triage vital signs and the nursing notes.  Pertinent labs & imaging results that were available during my care of the patient were reviewed by me and considered in my medical decision making (see chart for details).  Clinical Course as of Dec 28 655  Wed Dec 28, 2019  8756 I evaluated the patient again as she was feeling chest pressure.  Heart rate is in the 130s.   It appears to be sinus tachycardia on the monitor.  Will repeat EKG.  She is very anxious.  She has been screened for blood clots and her D-dimer was negative.  Her troponin elevated slightly to 37; however, prehospital heart rate was in the 220s.  She is otherwise low risk for ischemia and I have low suspicion for ACS.  We will plan for further trending.  If this continues to uptrend, she will need hospitalization for cardiac monitoring.  Patient given a dose of Ativan.   [CH]    Clinical Course User Index [CH] Zyion Doxtater, Mayer Masker, MD   MDM Rules/Calculators/A&P                          Patient presents with tachycardia, palpitations, chest pain.  She was found to be in SVT by EMS.  She broke with adenosine.  She is in sinus tachycardia here.  She appears very anxious.  She was recently evaluated for some chest pain.  She is low risk for ACS.  She does report some chest pressure.  Lab work obtained including troponin given her chest pressure although she is low risk.  EKG shows sinus tachycardia without evidence of ischemia or arrhythmia.  Chest x-ray shows no evidence of pneumothorax or pneumonia.  TSH is within normal range.  Initial troponin is reassuring at 10.  D-dimer is 0.44 and I have low suspicion for PE.  Repeat troponin noted to slightly elevate to 37.  However given that her heart rate was in the 220s up prior to arrival, this is not necessarily unexpected.  I do not feel that this represents ACS.  However we will continue to trend.  See clinical course above.  Final Clinical Impression(s) / ED Diagnoses Final diagnoses:  SVT (supraventricular tachycardia) (HCC)    Rx / DC Orders ED Discharge Orders    None       Mckinze Poirier, Mayer Masker, MD 12/28/19 [phone]

## 2021-03-16 IMAGING — CT CT HEAD WITHOUT CONTRAST
4 series · 17 of 47 positions shown, 19 images · non-contrast
Comparison: None.

CLINICAL DATA: Headaches

EXAM:
CT HEAD WITHOUT CONTRAST
TECHNIQUE: Contiguous axial images were obtained from the base of the skull
through the vertex without intravenous contrast.

[Series 3: head wo · axial · 0.44mm/px · z∈[-114,+6]mm · 7 of 32 slices shown, 9 images]
[im 4/32  brain]
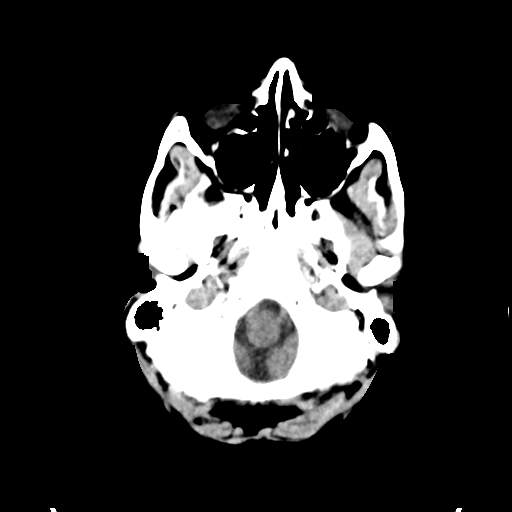
[im 4/32  bone]
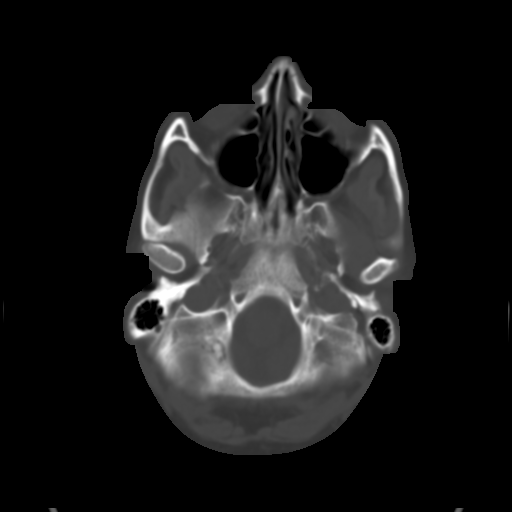
[im 8/32  brain]
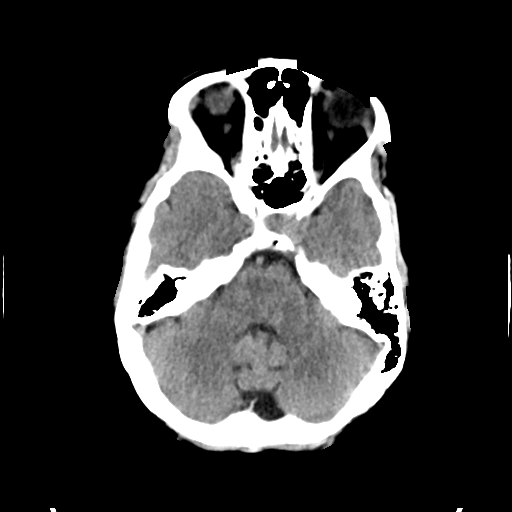
[im 12/32  brain]
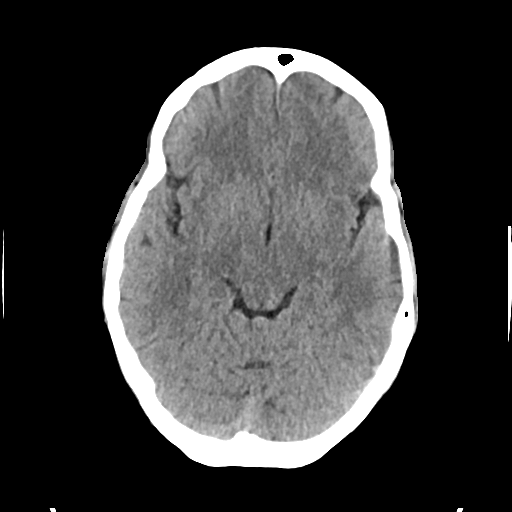
[im 16/32  brain]
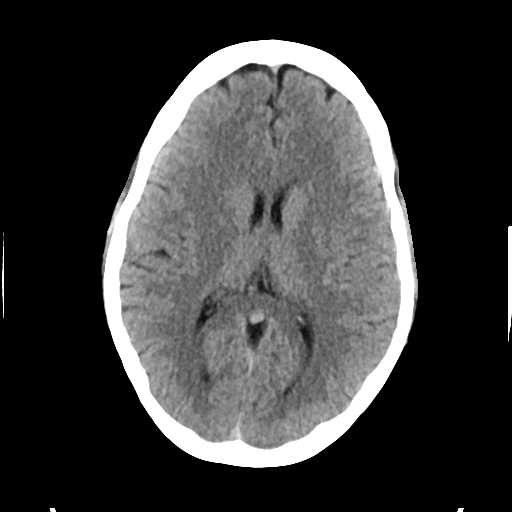
[im 20/32  brain]
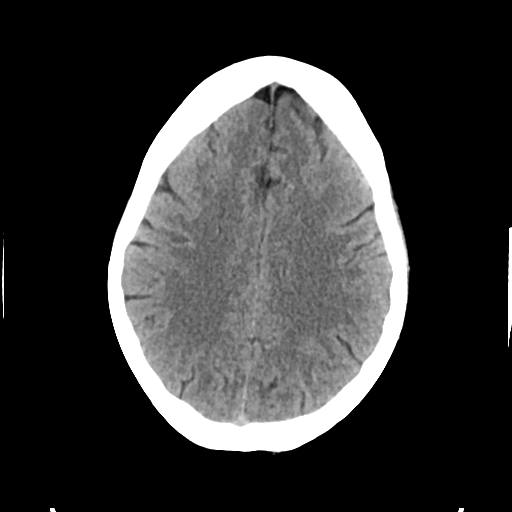
[im 20/32  bone]
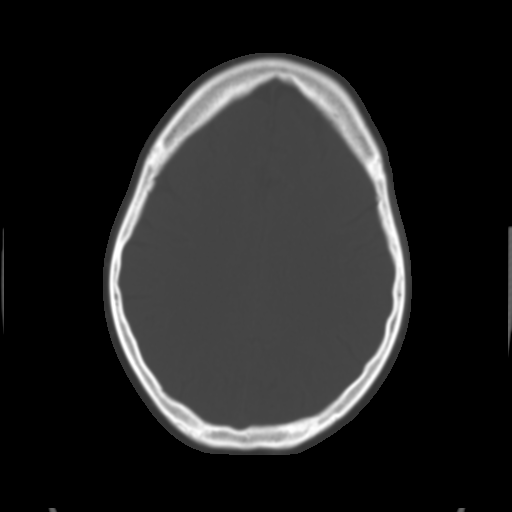
[im 24/32  brain]
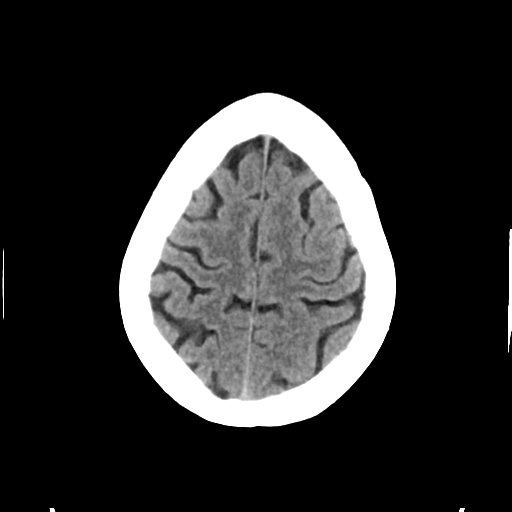
[im 28/32  brain]
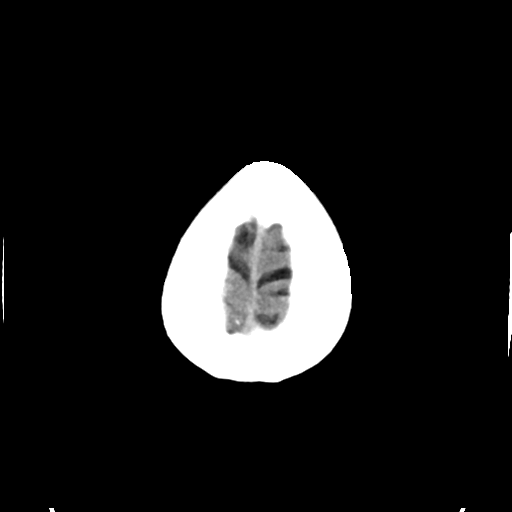

[Series 4: head bone · axial · 0.44mm/px · z∈[-115,-59]mm · 4 of 80 slices shown]
[im 8/80  bone]
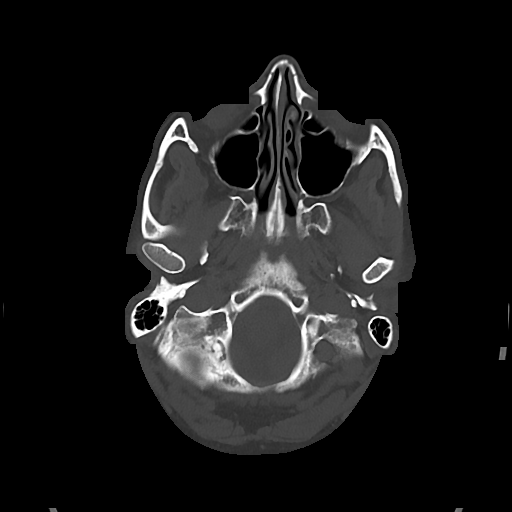
[im 16/80  bone]
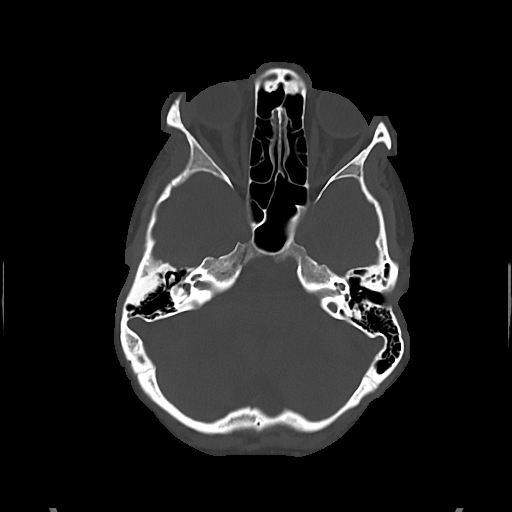
[im 24/80  bone]
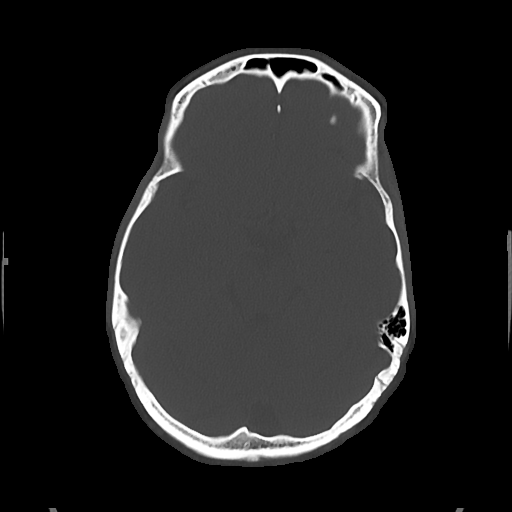
[im 36/80  bone]
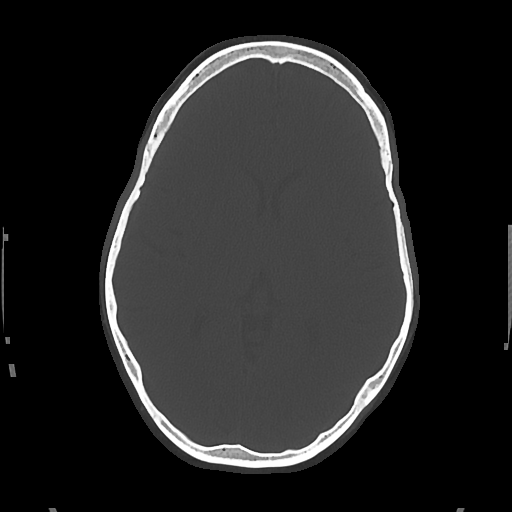

[Series 5: cor soft · coronal · 0.31mm/px · 3 of 68 slices shown]
[im 23/68  brain]
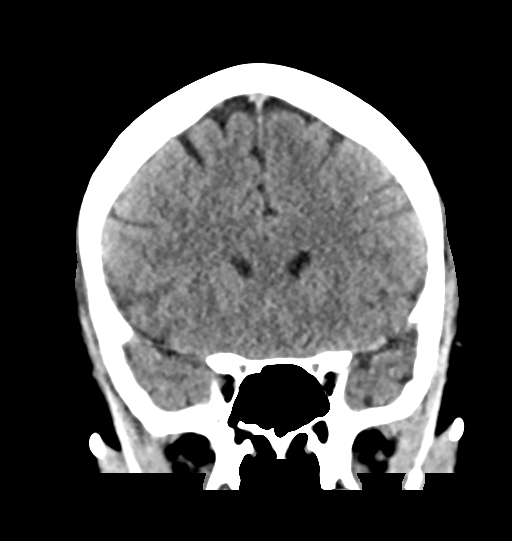
[im 30/68  brain]
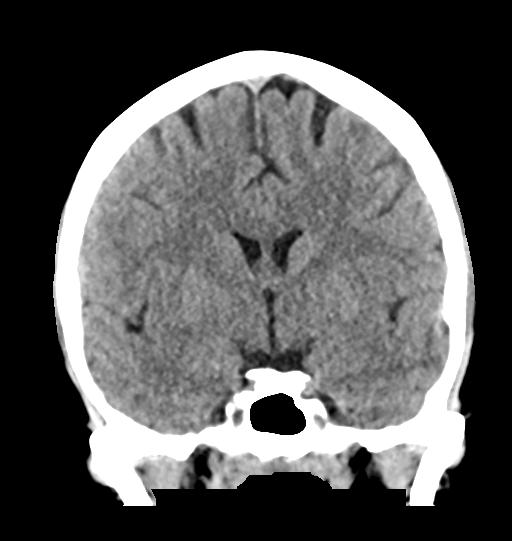
[im 38/68  brain]
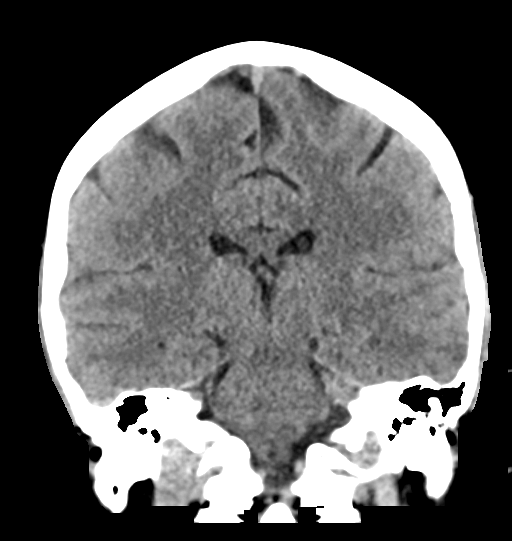

[Series 6: sag soft · sagittal · 0.32mm/px · 3 of 55 slices shown]
[im 19/55  brain]
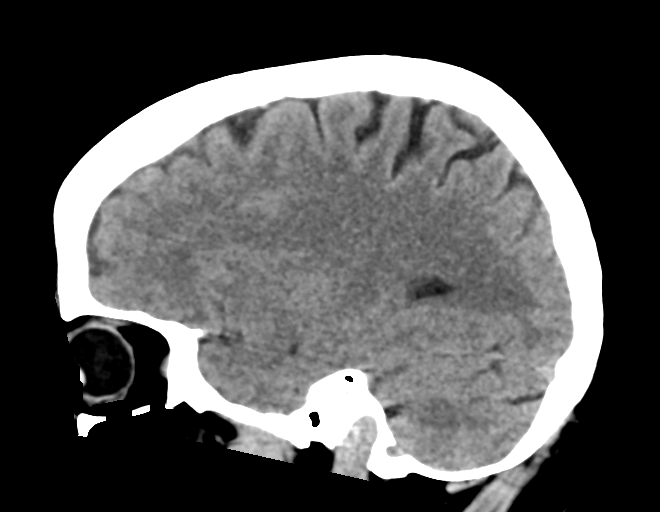
[im 28/55  brain]
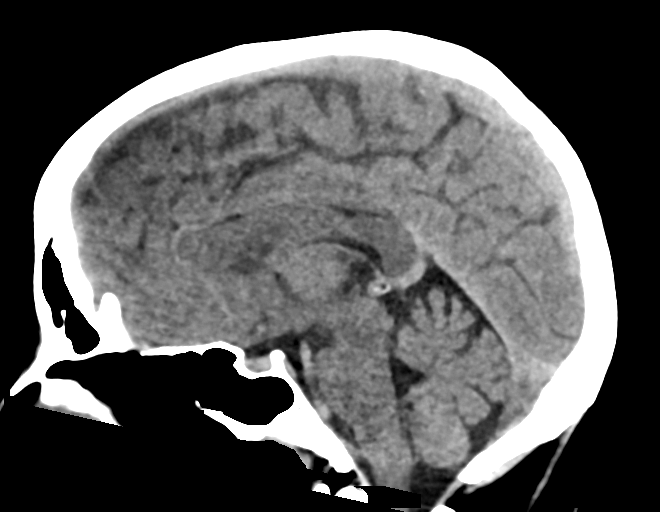
[im 37/55  brain]
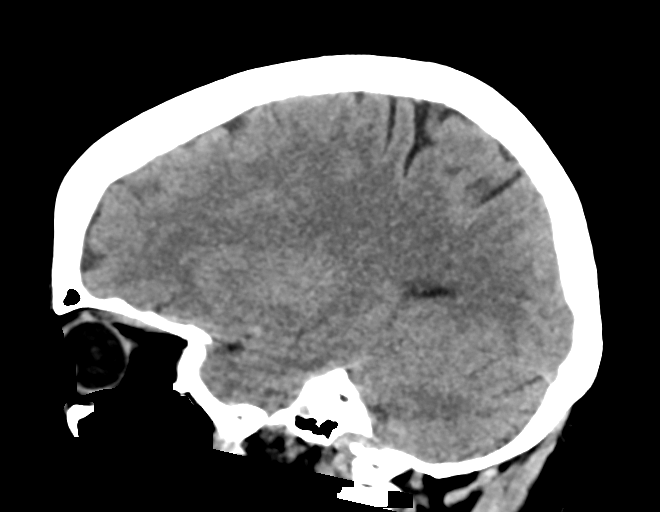

[17 of 47 positions shown; findings below may reference images not displayed]

FINDINGS: Brain: No evidence of acute infarction, hemorrhage, hydrocephalus,
extra-axial collection or mass lesion/mass effect.

Vascular: No hyperdense vessel or unexpected calcification.

Skull: No osseous abnormality.

Sinuses/Orbits: Visualized paranasal sinuses are clear. Visualized
mastoid sinuses are clear. Visualized orbits demonstrate no focal
abnormality.

Other: None
IMPRESSION: No acute intracranial pathology.

## 2022-07-31 IMAGING — CR DG CHEST 2V
2 series · 2 of 2 positions shown · non-contrast
Comparison: None.

CLINICAL DATA: Chest pain

EXAM:
CHEST - 2 VIEW

[w chest pa]
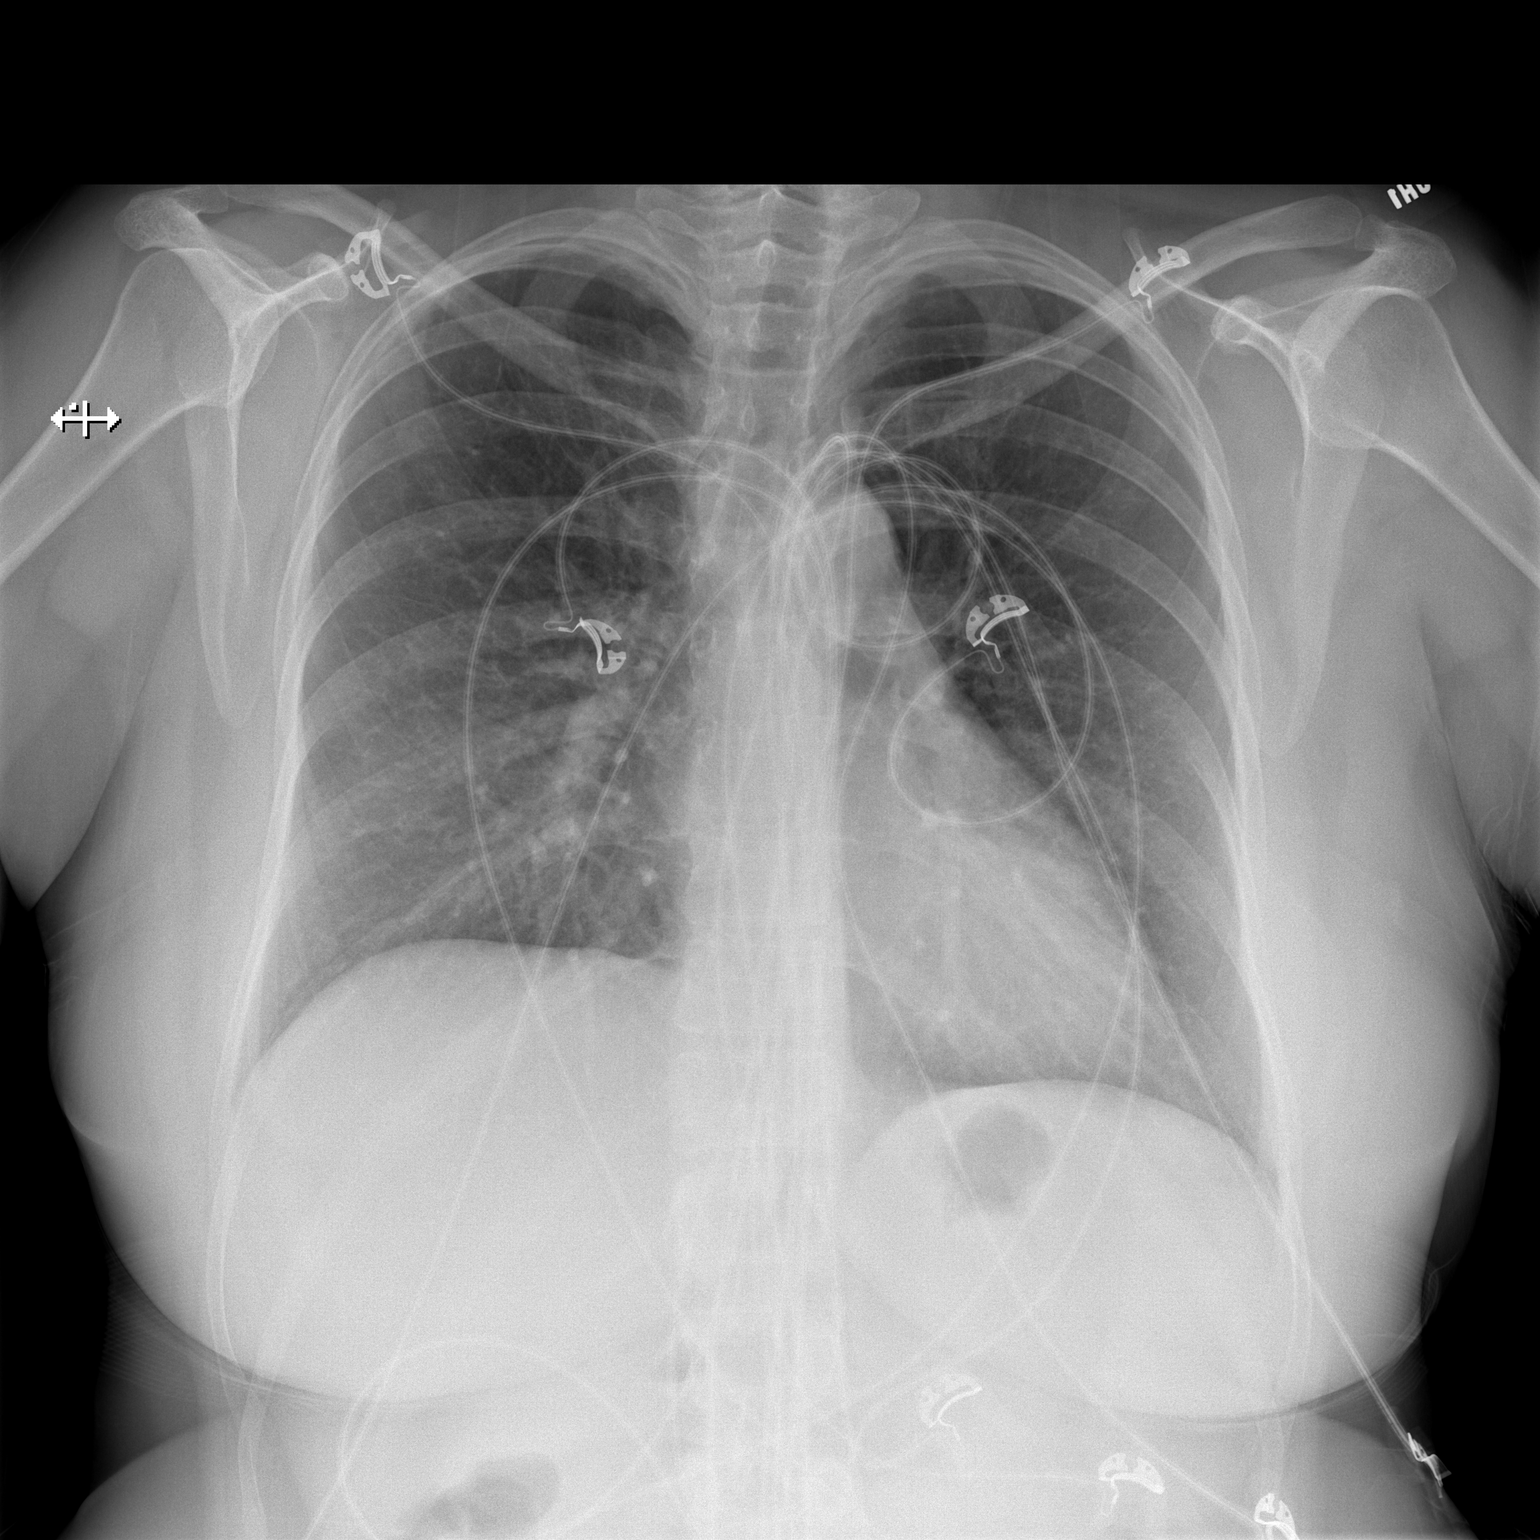

[w chest lat]
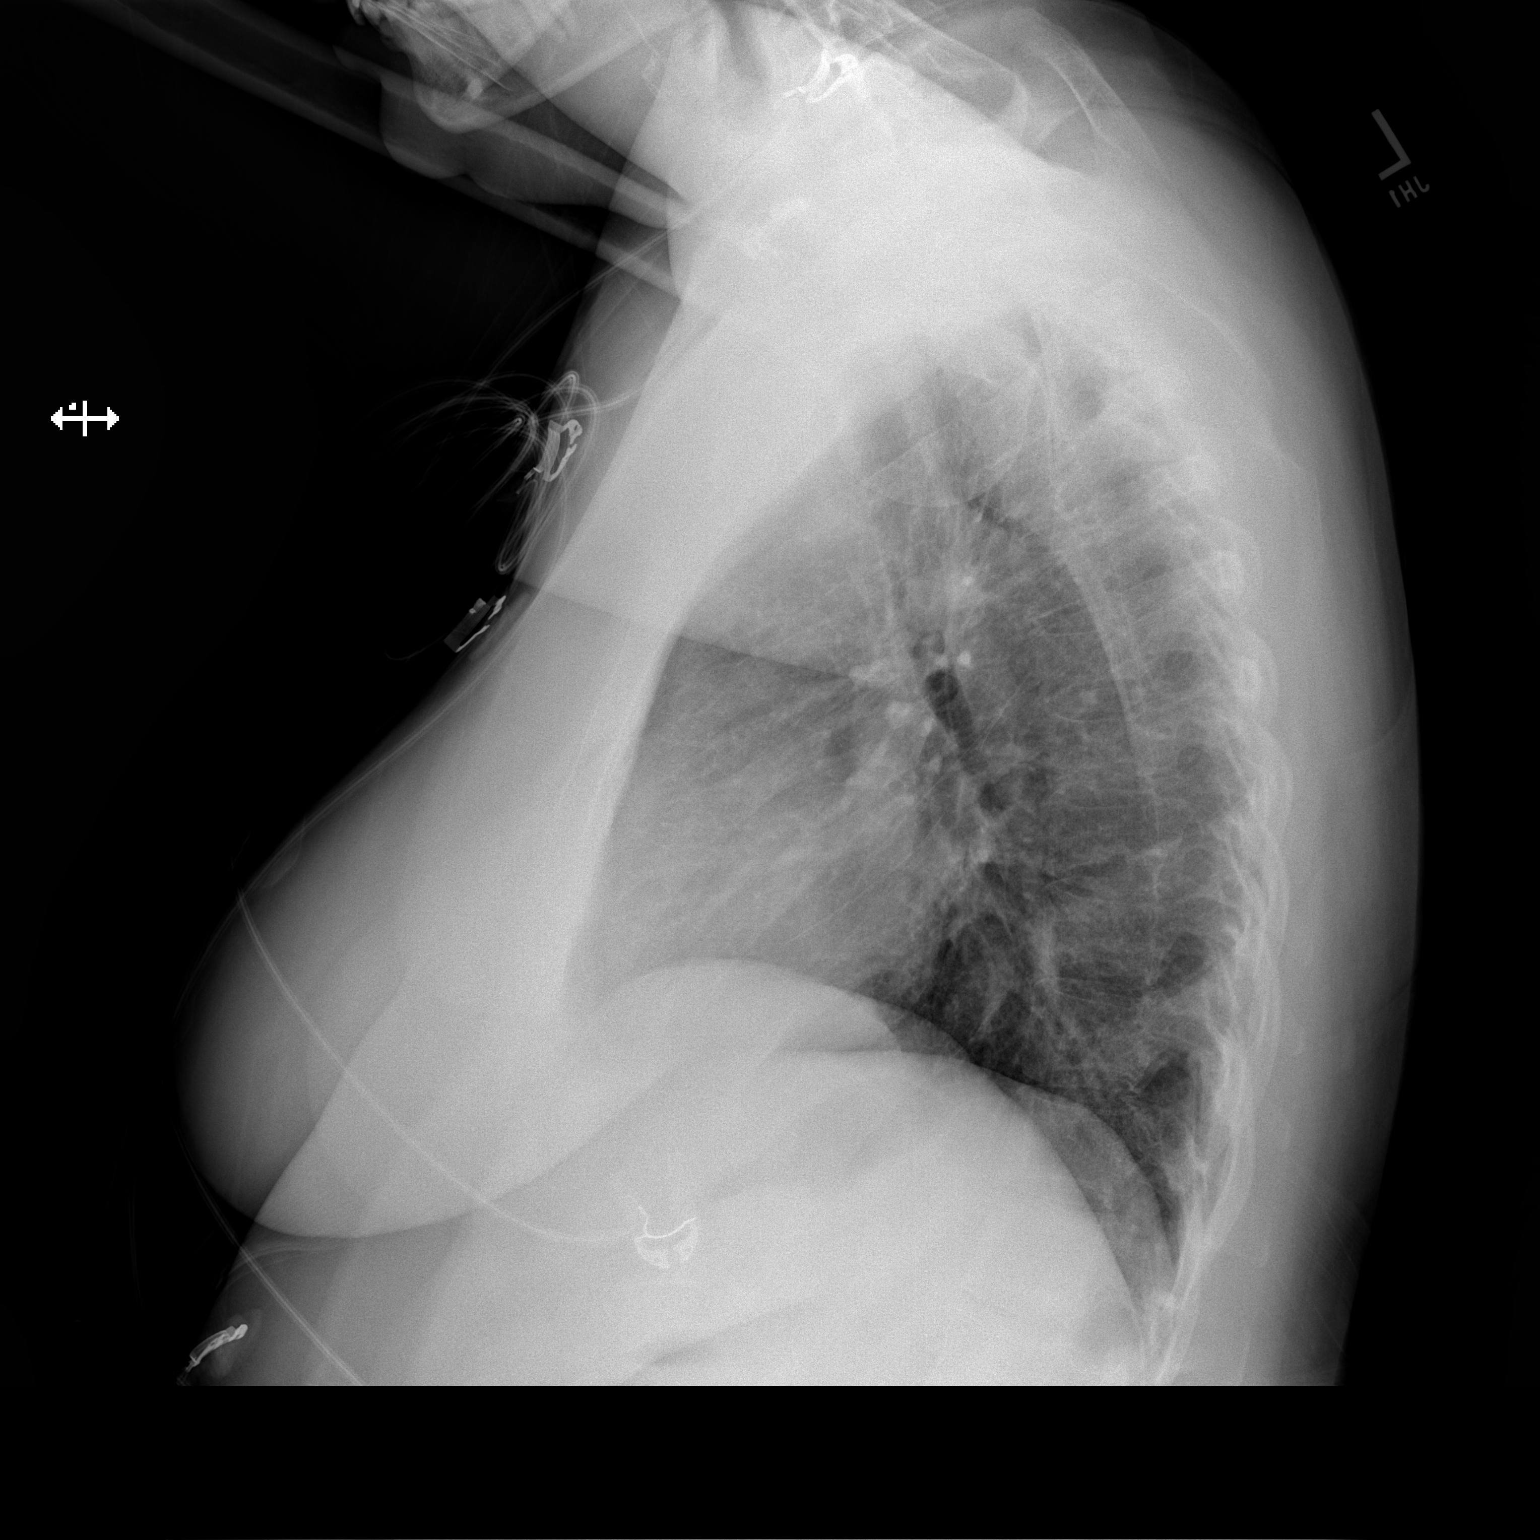

[2 of 2 positions shown; findings below may reference images not displayed]

FINDINGS: The heart size and mediastinal contours are within normal limits.
Both lungs are clear. The visualized skeletal structures are
unremarkable.
IMPRESSION: No active cardiopulmonary disease.
